# Patient Record
Sex: Female | Born: 1960 | Race: White | Hispanic: No | Smoking: Current every day smoker
Health system: Southern US, Community
[De-identification: ages and names within clinical notes are randomized; demographics above are authoritative.]

## PROBLEM LIST (undated history)

## (undated) DIAGNOSIS — E785 Hyperlipidemia, unspecified: Secondary | ICD-10-CM

## (undated) DIAGNOSIS — G2581 Restless legs syndrome: Secondary | ICD-10-CM

## (undated) HISTORY — PX: SHOULDER ARTHROSCOPY: SHX128

## (undated) HISTORY — PX: TUBAL LIGATION: SHX77

## (undated) HISTORY — PX: BACK SURGERY: SHX140

---

## 1997-10-25 ENCOUNTER — Ambulatory Visit (HOSPITAL_COMMUNITY): Admission: RE | Admit: 1997-10-25 | Discharge: 1997-10-25 | Payer: Self-pay | Admitting: *Deleted

## 1997-11-05 ENCOUNTER — Inpatient Hospital Stay (HOSPITAL_COMMUNITY): Admission: AD | Admit: 1997-11-05 | Discharge: 1997-11-05 | Payer: Self-pay | Admitting: *Deleted

## 1999-01-12 ENCOUNTER — Other Ambulatory Visit: Admission: RE | Admit: 1999-01-12 | Discharge: 1999-01-12 | Payer: Self-pay | Admitting: Family Medicine

## 1999-02-23 ENCOUNTER — Encounter: Payer: Self-pay | Admitting: *Deleted

## 1999-02-23 ENCOUNTER — Ambulatory Visit (HOSPITAL_COMMUNITY): Admission: RE | Admit: 1999-02-23 | Discharge: 1999-02-23 | Payer: Self-pay | Admitting: *Deleted

## 2000-08-18 ENCOUNTER — Ambulatory Visit (HOSPITAL_COMMUNITY): Admission: RE | Admit: 2000-08-18 | Discharge: 2000-08-18 | Payer: Self-pay

## 2004-05-17 ENCOUNTER — Other Ambulatory Visit: Admission: RE | Admit: 2004-05-17 | Discharge: 2004-05-17 | Payer: Self-pay | Admitting: Family Medicine

## 2008-04-13 ENCOUNTER — Emergency Department (HOSPITAL_COMMUNITY): Admission: EM | Admit: 2008-04-13 | Discharge: 2008-04-13 | Payer: Self-pay | Admitting: Emergency Medicine

## 2010-06-27 ENCOUNTER — Encounter: Payer: Self-pay | Admitting: Family Medicine

## 2010-08-03 ENCOUNTER — Other Ambulatory Visit (HOSPITAL_COMMUNITY)
Admission: RE | Admit: 2010-08-03 | Discharge: 2010-08-03 | Disposition: A | Discharge status: Home / Self Care | Payer: Federal, State, Local not specified - PPO | Source: Ambulatory Visit | Attending: Family Medicine | Admitting: Family Medicine

## 2010-08-03 ENCOUNTER — Other Ambulatory Visit: Payer: Self-pay | Admitting: Family Medicine

## 2010-08-03 DIAGNOSIS — Z124 Encounter for screening for malignant neoplasm of cervix: Secondary | ICD-10-CM | POA: Insufficient documentation

## 2010-08-03 DIAGNOSIS — Z1159 Encounter for screening for other viral diseases: Secondary | ICD-10-CM | POA: Insufficient documentation

## 2010-10-19 NOTE — Consult Note (Signed)
NAMESAPHIRE, BARNHART NO.:  [id number]   MEDICAL RECORD NO.:  [id number]          PATIENT TYPE:  EMS   LOCATION:  MAJO                         FACILITY:  [facility]   PHYSICIAN:  Pollyann Savoy, MD  DATE OF BIRTH:  1960/10/03   DATE OF CONSULTATION:  04/13/2008  DATE OF DISCHARGE:                                 CONSULTATION   PRIMARY CARE PHYSICIANS:  Anna Genre. Little, MD and Sigmund Hazel, MD   REFERRING PHYSICIAN:  Pollyann Savoy, MD   REASON FOR CONSULTATION:  Ms. Kalmar is being seen at the request of  Dr. Bernette Mayers for the evaluation of chest pain.   HISTORY OF PRESENT ILLNESS:  Ms. Amyx is a 50 year old female with  prior history of GERD and restless leg syndrome along with 3 prior back  surgeries who quit tobacco in 2004 who over the past few days has had  intensification of chest discomfort.  On Thursday while shopping at a  clothing store, she saw flickering in front of her eyes and then  developed a sudden onset 10/10 intense chest pain which lasted 10-15  minutes around the left side of her breast in axillary area with  associated right arm discomfort.  This occurred while walking around the  store and eventually subsided after stopping and resting.  She states  that she had some mild shortness of breath, diaphoresis surrounding this  incident.  She had another episode yesterday at [facility] in the  hospitality division at around 2:00 p.m. with very strong symptoms.  She  fell like as though she had to sit down to catch her breath and this  chest discomfort lasted approximately 5 minutes.  This morning, she woke  up, feeling some nausea and has been having low-grade mild left-sided  chest discomfort all day long and reports no change with eating.  No  significant shortness of breath or diaphoresis today.  She states that  she is perimenopausal and she has been going through some strange  symptoms recently.  She denies any syncope, bleeding,  fevers on recent  travel, leg pain.  As far as cardiac risk factors ago, she does not have  diabetes, hypertension, and early diabetes or hypertension and she is a  former smoker, unknown lipids at this time although she does not think  she has hyperlipidemia.  Her sister at age 31 year old did die suddenly  of unknown causes after attending a party.  Currently, she is  comfortable in the [facility] without any distress.   PAST MEDICAL HISTORY:  1. Back pain status post 3 prior back surgeries.  2. Restless leg syndrome.  3. GERD - treated over the past few years with omeprazole.   PAST SURGICAL HISTORY:  Back surgery x3.   ALLERGIES:  No known drug allergies.   MEDICATIONS:  1. ReQuip 1 mg once a day.  2. Omeprazole 20 mg once a day.   SOCIAL HISTORY:  She quit smoking in 2004.  After several years, she  drinks no alcohol or very rare wine.  No illicit drug use.  She is  accompanied today by her sister.   FAMILY HISTORY:  Her father was essentially unknown.  Her mother died at  age 58 after stopping eating.  She battled with hypertension.  She had a  sister as above who after a party at age 34 year old went home and died  suddenly.  Unknown cause.   REVIEW OF SYSTEMS:  No bleeding.  No syncope.  No fevers.  She does have  frequent nausea especially in the morning and occasional vomiting which  has been going on for quite some time.  Unless specified above, all  other 12 review of systems negative.   PHYSICAL EXAM:  VITAL SIGNS:  Blood pressure 110/80, heart rate 54,  respirations 16, afebrile, sating 100% on room air.  GENERAL:  Alert and oriented x3 in no acute distress, pleasant, here  with her sister.  EYES:  Well-perfused conjunctivae.  EOMI.  No scleral icterus.  NECK:  Supple.  No JVD.  No carotid bruits.  No thyromegaly appreciated.  No lymphadenopathy.  Full range of motion noted.  CARDIOVASCULAR:  Bradycardic, regular rhythm with no  appreciable  murmurs, rubs, or gallops.  Normal PMI.  LUNGS:  Clear to auscultation bilaterally.  Normal respiratory effort.  No wheezes.  ABDOMEN:  Soft, nontender.  Normoactive bowel sounds.  No bruits  appreciated.  No masses.  EXTREMITIES:  No clubbing, cyanosis, or edema.  Slightly thickened lower  extremities.  Normal distal pulses palpated.  SKIN:  Warm, dry, and intact.  No rashes over chest wall.  NEUROLOGIC:  Nonfocal.  Gait was not assessed.  No tremors note.  PSYCHIATRY:  Normal affect.   DATA:  EKG obtained today in the emergency room shows sinus rhythm with  no other ST changes.  No Q-waves noted.  Normal R-wave progression.  When compared to prior ECG obtained earlier today at [facility], there is no significant change.  Chest x-ray personally reviewed  shows mild hyperinflation of the lungs consistent with mild COPD.  No  acute airspace disease noted.  No cardiomegaly.   LABS:  First set of cardiac biomarkers are all normal.  Chemistry;  unremarkable with sodium 138, potassium 3.7, BUN 13, creatinine 0.66,  glucose 94, white count 6.9, hemoglobin 14.1, hematocrit 43, and  platelets 232.  [facility] records from [facility] reviewed.   ASSESSMENT AND PLAN:  A 50 year old female with a sister who died  suddenly at age 63 from unknown causes, possible sudden cardiac death  with worsening chest pain atypical with prior history of  gastroesophageal reflux disease.  1. Chest pain - atypical chest discomfort.  She did have some mild      tenderness to palpation over the chest wall.  She did not have any      significant upper epigastric or right upper quadrant tenderness.      Possible etiologies do include cardiac, gastroesophageal reflux      disease, discomfort from gallbladder disease.  At this point given      the long duration of her symptoms and negative cardiac biomarkers      as well as reassuring ECG without any acute ST changes, we      discussed  the possibility of obtaining a second set of cardiac      biomarkers here in the emergency room and if this is normal,      discharging home with close followup, stress test in the outpatient      setting.  She is amenable to  this.  I did offer her overnight stay,      however.  She knows to contact me immediately if any of her      symptoms change.  I did ask her to take an aspirin 81 mg once a day      and do also double up on her omeprazole to 20 mg twice a day.      Certainly, these symptoms could be associated with gastroesophageal      reflux disease or gastrointestinal especially given the recurrent      nausea in the a.m. hours.  However, I do feel as though ruling out      a cardiac etiology is very important for her especially in this      perimenopausal and especially with her sister's history of possible      sudden cardiac death.  As of note, her EKG upon close inspection      does not have any evidence of prolonged QT syndrome.  In addition,      she does not have      any classic Brugada criteria.  She denies any palpitations or      syncope.  I will be seeing her in close followup.  She has my card.      After the second set of cardiac biomarkers; if they are normal, I      am fine with her being discharged.      Jake Bathe, MD  Electronically Signed     ______________________________  Pollyann Savoy, MD    MCS/MEDQ  D:  04/13/2008  T:  04/13/2008  Job:  [id number]   cc:   Pollyann Savoy, MD  Anna Genre Little, M.D.  Sigmund Hazel, M.D.

## 2011-03-08 LAB — BASIC METABOLIC PANEL
BUN: 13
CO2: 26
Calcium: 9.4
Chloride: 104
Creatinine, Ser: 0.66
GFR calc Af Amer: >60
GFR calc non Af Amer: >60
Glucose, Bld: 94
Potassium: 3.7
Sodium: 138

## 2011-03-08 LAB — DIFFERENTIAL
Basophils Absolute: 0.1
Basophils Relative: 1
Eosinophils Absolute: 0.2
Eosinophils Relative: 3
Lymphocytes Relative: 36
Lymphs Abs: 2.5
Monocytes Absolute: 0.4
Monocytes Relative: 6
Neutro Abs: 3.7
Neutrophils Relative %: 53

## 2011-03-08 LAB — CBC
HCT: 43.1
Hemoglobin: 14.1
MCHC: 32.7
MCV: 91.2
Platelets: 232
RBC: 4.73
RDW: 12.7
WBC: 6.9

## 2011-03-08 LAB — CK TOTAL AND CKMB (NOT AT ARMC)
CK, MB: 1
Relative Index: INVALID
Relative Index: INVALID
Total CK: 50
Total CK: 60

## 2011-03-08 LAB — TROPONIN I: Troponin I: <0.01

## 2013-02-10 ENCOUNTER — Emergency Department (INDEPENDENT_AMBULATORY_CARE_PROVIDER_SITE_OTHER)
Admission: EM | Admit: 2013-02-10 | Discharge: 2013-02-10 | Disposition: A | Discharge status: Home / Self Care | Payer: Federal, State, Local not specified - PPO | Source: Home / Self Care | Attending: Family Medicine | Admitting: Family Medicine

## 2013-02-10 ENCOUNTER — Emergency Department (INDEPENDENT_AMBULATORY_CARE_PROVIDER_SITE_OTHER): Payer: Federal, State, Local not specified - PPO

## 2013-02-10 DIAGNOSIS — S59909A Unspecified injury of unspecified elbow, initial encounter: Secondary | ICD-10-CM

## 2013-02-10 DIAGNOSIS — S63599A Other specified sprain of unspecified wrist, initial encounter: Secondary | ICD-10-CM

## 2013-02-10 HISTORY — DX: Hyperlipidemia, unspecified: E78.5

## 2013-02-10 MED ORDER — MELOXICAM 15 MG PO TABS: 15.0000 mg | ORAL_TABLET | Freq: Every day | ORAL | Status: DC
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
## 2015-11-27 ENCOUNTER — Ambulatory Visit (HOSPITAL_BASED_OUTPATIENT_CLINIC_OR_DEPARTMENT_OTHER)
Admit: 2015-11-27 | Discharge: 2015-11-27 | Disposition: A | Discharge status: Home / Self Care | Payer: Federal, State, Local not specified - PPO | Attending: Family Medicine | Admitting: Family Medicine

## 2015-11-27 ENCOUNTER — Telehealth: Payer: Self-pay | Admitting: Emergency Medicine

## 2015-11-27 ENCOUNTER — Emergency Department
Admission: EM | Admit: 2015-11-27 | Discharge: 2015-11-27 | Disposition: A | Discharge status: Home / Self Care | Payer: Federal, State, Local not specified - PPO | Source: Home / Self Care

## 2015-11-27 ENCOUNTER — Encounter: Payer: Self-pay | Admitting: *Deleted

## 2015-11-27 DIAGNOSIS — R519 Headache, unspecified: Secondary | ICD-10-CM

## 2015-11-27 DIAGNOSIS — R51 Headache: Secondary | ICD-10-CM | POA: Diagnosis not present

## 2015-11-27 HISTORY — DX: Restless legs syndrome: G25.81

## 2015-11-27 LAB — POCT CBC W AUTO DIFF (K'VILLE URGENT CARE)

## 2015-11-27 NOTE — ED Notes (Addendum)
Pt c/o pain to the left side of her head x 1 months. Today it is much worse, described as burning and sharp. C/o some associated nausea and dizziness. IBF and Excedrin taken without relief. Declined in-house IBF or tylenol

## 2015-11-27 NOTE — ED Provider Notes (Addendum)
CSN: [id number]     Arrival date & time 11/27/15  1353 History   None    Chief Complaint  Patient presents with  . Headache    head pain      HPI Comments: Patient complains of approximately two month history of intermittent daily left side headaches.  During the past two days her headache has been constant, sharp, "pulsing," and throbbing.  She has had difficulty sleeping because of the headache.  No localizing neurologic symptoms.  Patient is a 55 y.o. female presenting with headaches. The history is provided by the patient and the spouse.  Headache Pain location:  L temporal and L parietal Quality:  Sharp and stabbing Radiates to:  Does not radiate Severity currently:  9/10 Severity at highest:  10/10 Onset quality:  Gradual Duration:  8 weeks Timing:  Constant Progression:  Worsening Chronicity:  Chronic Similar to prior headaches: yes   Context: activity and bright light   Relieved by:  Nothing Worsened by:  Nothing Ineffective treatments:  NSAIDs Associated symptoms: no blurred vision, no congestion, no dizziness, no drainage, no ear pain, no eye pain, no facial pain, no fatigue, no fever, no focal weakness, no hearing loss, no loss of balance, no myalgias, no nausea, no near-syncope, no neck pain, no neck stiffness, no numbness, no paresthesias, no photophobia, no sinus pressure, no sore throat, no swollen glands, no tingling and no visual change     Past Medical History  Diagnosis Date  . Hyperlipidemia   . RLS (restless legs syndrome)    Past Surgical History  Procedure Laterality Date  . Back surgery     History reviewed. No pertinent family history. Social History  Substance Use Topics  . Smoking status: Current Every Day Smoker -- 0.50 packs/day  . Smokeless tobacco: None  . Alcohol Use: No   OB History    No data available     Review of Systems  Constitutional: Negative for fever and fatigue.  HENT: Negative for congestion, ear pain, hearing loss,  postnasal drip, sinus pressure and sore throat.        Patient notes mild pain with chewing.  Eyes: Negative for blurred vision, photophobia and pain.  Cardiovascular: Negative for near-syncope.  Gastrointestinal: Negative for nausea.  Musculoskeletal: Negative for myalgias, neck pain and neck stiffness.  Neurological: Positive for headaches. Negative for dizziness, focal weakness, numbness, paresthesias and loss of balance.  All other systems reviewed and are negative.   Allergies  Review of patient's allergies indicates no known allergies.  Home Medications   Prior to Admission medications   Medication Sig Start Date End Date Taking? Authorizing Provider  atorvastatin (LIPITOR) 20 MG tablet Take 20 mg by mouth daily.   Yes Historical Provider, MD  cetirizine (ZYRTEC) 10 MG tablet Take 10 mg by mouth daily.   Yes Historical Provider, MD   Meds Ordered and Administered this Visit  Medications - No data to display  BP 115/80 mmHg  Pulse 78  Temp(Src) 98.4 F (36.9 C) (Oral)  Wt 250 lb (113.399 kg)  SpO2 98% No data found.   Physical Exam  Constitutional: She is oriented to person, place, and time. She appears well-developed and well-nourished. No distress.  HENT:  Head: Normocephalic.    Right Ear: Tympanic membrane, external ear and ear canal normal.  Left Ear: Tympanic membrane, external ear and ear canal normal.  There is diffuse tenderness to palpation over the left temporal and parietal areas as noted on diagram.  There is distinct tenderness to palpation over the left temporal artery.    Eyes: Conjunctivae and EOM are normal. Pupils are equal, round, and reactive to light.  Neck: Neck supple.  Cardiovascular: Normal heart sounds.   Pulmonary/Chest: Breath sounds normal.  Abdominal: There is no tenderness.  Musculoskeletal: She exhibits no edema or tenderness.  Lymphadenopathy:    She has no cervical adenopathy.  Neurological: She is alert and oriented to person,  place, and time. She has normal reflexes. No cranial nerve deficit. Coordination normal.  Skin: Skin is warm and dry. No rash noted.  Nursing note and vitals reviewed.   ED Course  Procedures none    Labs Reviewed  C-REACTIVE PROTEIN - Abnormal; Notable for the following:    CRP 2.0 (*)    All other components within normal limits   Narrative:    Performed at:  [facility]                [address], [address]                [place], [place] [address]  SEDIMENTATION RATE   Narrative:    Performed at:  [facility]                [address], [address]                [place], [place] [address]  POCT CBC W AUTO DIFF ([facility]):  WBC 9.7; LY 33.6; MO 6.5; GR 59.9; Hgb 13.9; Platelets 261     Imaging Review CT Head Wo Contrast (Final result) Result time: 11/27/15 16:45:49   Final result by Rad Results In Interface (11/27/15 16:45:49)   Narrative:   CLINICAL DATA: Left site headache for 2 months, worsening pain and dizziness today  EXAM: CT HEAD WITHOUT CONTRAST  TECHNIQUE: Contiguous axial images were obtained from the base of the skull through the vertex without intravenous contrast.  COMPARISON: None.  FINDINGS: Brain: No intracranial hemorrhage, mass effect or midline shift. No hydrocephalus. No acute cortical infarction. No mass lesion is noted on this unenhanced scan. No intra or extra-axial fluid collection. The gray and white-matter differentiation is preserved.  Vascular: No vascular calcifications are noted.  Skull: Negative for fracture or focal lesion.  Sinuses/Orbits: No acute findings. The mastoid air cells are unremarkable.  Other: None.  IMPRESSION: No acute intracranial abnormality. No hydrocephalus. No definite acute cortical infarction.   Electronically Signed By: Natasha MeadLiviu Pop M.D. On: 11/27/2015 16:45        MDM   1. Left temporal headache    Concern for possible temporal arteritis.  Note  elevated C reactive protein. Recommend follow-up with PCP or neurologist as soon as possible.     Lattie HawStephen A Naveyah Iacovelli, MD 12/02/15 1606  Lattie HawStephen A Galya Dunnigan, MD 12/02/15 [phone]

## 2015-11-28 LAB — SEDIMENTATION RATE: SED RATE: 27 mm/h (ref 0–30)

## 2015-11-28 LAB — C-REACTIVE PROTEIN: CRP: 2 mg/dL — AB (ref <0.60)

## 2015-12-02 ENCOUNTER — Telehealth: Payer: Self-pay | Admitting: Emergency Medicine

## 2015-12-02 NOTE — Discharge Instructions (Signed)

## 2015-12-02 NOTE — ED Notes (Signed)
Dr Cathren HarshBeese would like for you to [facility]: [facility], and see Neurologist for your headaches. Please call if you have any questions. [phone]

## 2016-11-28 ENCOUNTER — Emergency Department
Admission: EM | Admit: 2016-11-28 | Discharge: 2016-11-28 | Disposition: A | Discharge status: Home / Self Care | Payer: Federal, State, Local not specified - PPO | Source: Home / Self Care | Attending: Family Medicine | Admitting: Family Medicine

## 2016-11-28 DIAGNOSIS — T7840XA Allergy, unspecified, initial encounter: Secondary | ICD-10-CM | POA: Diagnosis not present

## 2016-11-28 MED ORDER — CEPHALEXIN 500 MG PO CAPS: 500.0000 mg | ORAL_CAPSULE | Freq: Two times a day (BID) | ORAL | 0 refills | Status: AC

## 2016-11-28 MED ORDER — PREDNISONE 20 MG PO TABS: ORAL_TABLET | ORAL | 0 refills | Status: AC

## 2016-11-28 NOTE — ED Triage Notes (Signed)
Pt was seen for UTI on 6/20.  Placed on antibiotic and developed rash.  Stopped taking antibiotic after 2 doses.  Hives over entire body per patient.

## 2016-11-28 NOTE — ED Provider Notes (Signed)
CSN: [id number]     Arrival date & time 11/28/16  1003 History   First MD Initiated Contact with Patient 11/28/16 1041     Chief Complaint  Patient presents with  . Rash   (Consider location/radiation/quality/duration/timing/severity/associated sxs/prior Treatment) HPI  Jill Mclaughlin is a 56 y.o. female presenting to UC with c/o erythematous itchy rash all over from her face to her toes that waxes and wanes.  Symptoms started about 4 days ago after starting nitrofurantoin on 11/23/16 for a UTI.  She has stopped taking the medication but rash has continued to wax and wane despite taking benadryl.  Denies fever, chills, swelling of mouth or difficulty breathing.  Pt's only known allergy until today is nickel.  Denies other new soaps, lotions or medications.    Past Medical History:  Diagnosis Date  . Hyperlipidemia   . RLS (restless legs syndrome)    Past Surgical History:  Procedure Laterality Date  . BACK SURGERY    . SHOULDER ARTHROSCOPY Bilateral   . TUBAL LIGATION     History reviewed. No pertinent family history. Social History  Substance Use Topics  . Smoking status: Current Every Day Smoker    Packs/day: 0.50  . Smokeless tobacco: Not on file  . Alcohol use No   OB History    No data available     Review of Systems  Constitutional: Negative for chills and fever.  Gastrointestinal: Negative for abdominal pain, nausea and vomiting.  Genitourinary: Positive for dysuria and frequency. Negative for hematuria, pelvic pain and urgency.  Musculoskeletal: Negative for arthralgias and myalgias.  Skin: Positive for rash. Negative for wound.  Neurological: Negative for dizziness and light-headedness.    Allergies  Nitrofurantoin  Home Medications   Prior to Admission medications   Medication Sig Start Date End Date Taking? Authorizing Provider  rOPINIRole (REQUIP) 1 MG tablet Take 1 mg by mouth 3 (three) times daily.   Yes [provider]  simvastatin (ZOCOR)  10 MG tablet Take 10 mg by mouth daily.   Yes [provider]  SUMAtriptan (IMITREX) 50 MG tablet Take 50 mg by mouth every 2 (two) hours as needed for migraine. May repeat in 2 hours if headache persists or recurs.   Yes [provider]  atorvastatin (LIPITOR) 20 MG tablet Take 20 mg by mouth daily.    [provider]  cephALEXin (KEFLEX) 500 MG capsule Take 1 capsule (500 mg total) by mouth 2 (two) times daily. 11/28/16   Lurene Shadow, PA-C  cetirizine (ZYRTEC) 10 MG tablet Take 10 mg by mouth daily.    [provider]  predniSONE (DELTASONE) 20 MG tablet 3 tabs po day one, then 2 po daily x 4 days 11/28/16   Lurene Shadow, PA-C   Meds Ordered and Administered this Visit  Medications - No data to display  BP 118/78 (BP Location: Left Arm)   Pulse 77   Temp 98.3 F (36.8 C) (Oral)   Ht 5\' 7"  (1.702 m)   Wt 255 lb (115.7 kg)   SpO2 97%   BMI 39.94 kg/m  No data found.   Physical Exam  Constitutional: She is oriented to person, place, and time. She appears well-developed and well-nourished. No distress.  HENT:  Head: Normocephalic and atraumatic.  Mouth/Throat: Oropharynx is clear and moist.  Eyes: EOM are normal.  Neck: Normal range of motion.  Cardiovascular: Normal rate and regular rhythm.   Pulmonary/Chest: Effort normal. No stridor. No respiratory distress. She  has no wheezes.  Musculoskeletal: Normal range of motion.  Neurological: She is alert and oriented to person, place, and time.  Skin: Skin is warm and dry. Rash noted. She is not diaphoretic. There is erythema.  Small amount of diffuse erythematous urticaria on face, bilateral arms and lower back.  Psychiatric: She has a normal mood and affect. Her behavior is normal.  Nursing note and vitals reviewed.   Urgent Care Course     Procedures (including critical care time)  Labs Review Labs Reviewed - No data to display  Imaging Review No results found.   MDM   1.  Allergic reaction to drug, initial encounter    Hx and exam c/w allergic reaction most likely due to the nitrofuratoin Will have pt discontinue that antibiotic completely and start on Keflex for UTI as she only had 2 doses of the first medication. Pt declined steroid shot in UC.  Rx: Prednisone and Keflex Home care instructions provided F/u with PCP in 1 week as needed.     Lurene Shadowhelps, Caryssa Elzey O, [place]-C 11/28/16 1205

## 2017-10-25 IMAGING — CT CT HEAD W/O CM
3 series · 15 of 47 positions shown, 18 images · non-contrast
Comparison: None.

CLINICAL DATA: Left site headache for 2 months, worsening pain and
dizziness today

EXAM:
CT HEAD WITHOUT CONTRAST
TECHNIQUE: Contiguous axial images were obtained from the base of the skull
through the vertex without intravenous contrast.

[Series 2: head wo · axial · 0.42mm/px · z∈[-210,-75]mm · 9 of 33 slices shown, 12 images]
[im 3/33  brain]
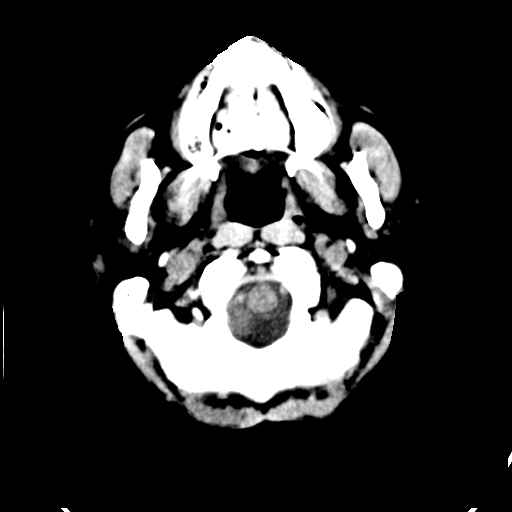
[im 3/33  bone]
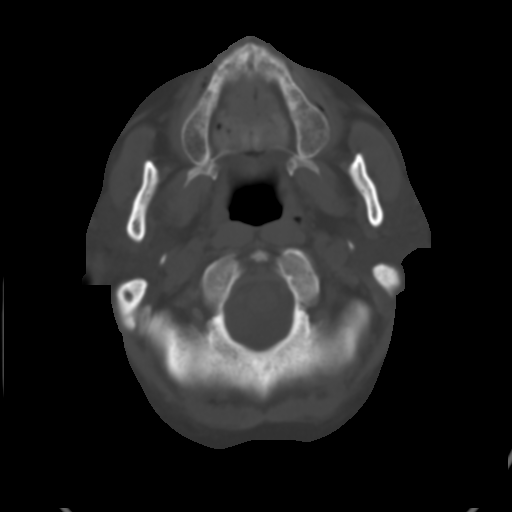
[im 6/33  brain]
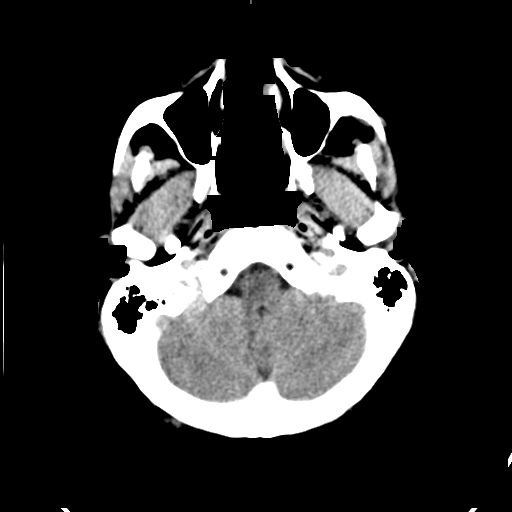
[im 9/33  brain]
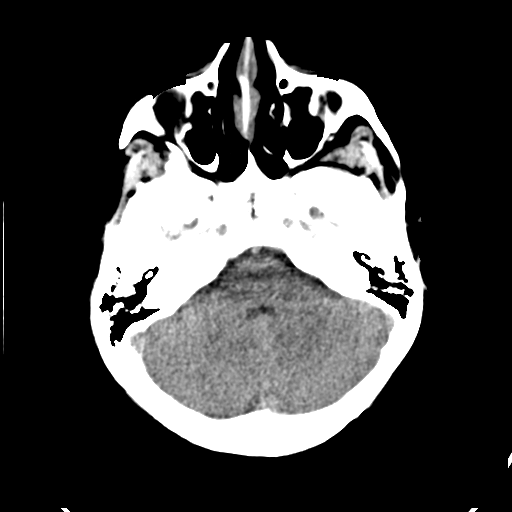
[im 13/33  brain]
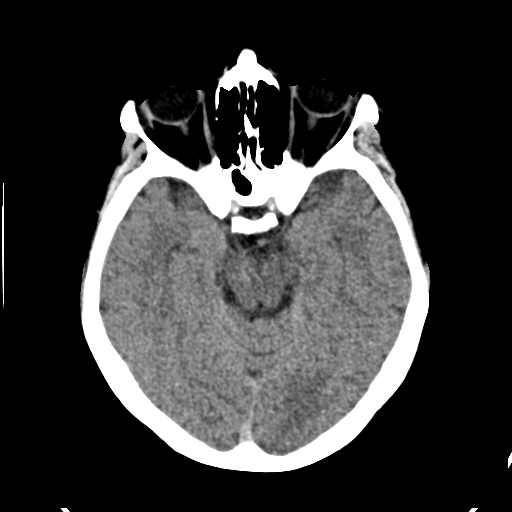
[im 17/33  brain]
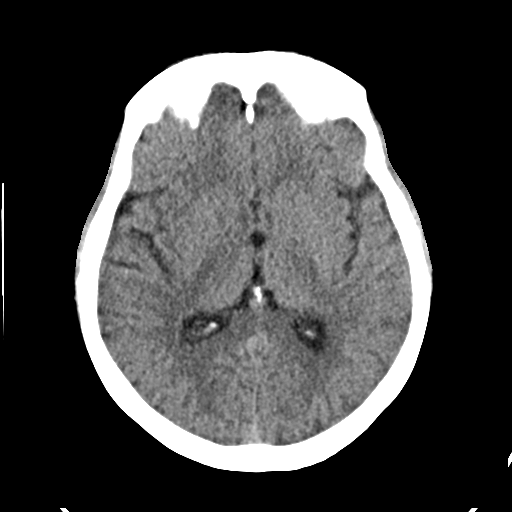
[im 17/33  bone]
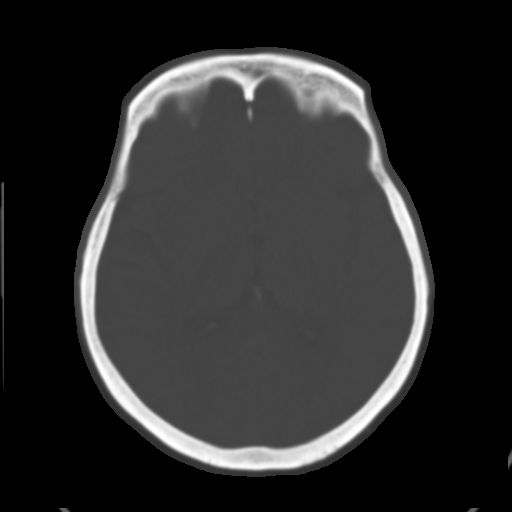
[im 20/33  brain]
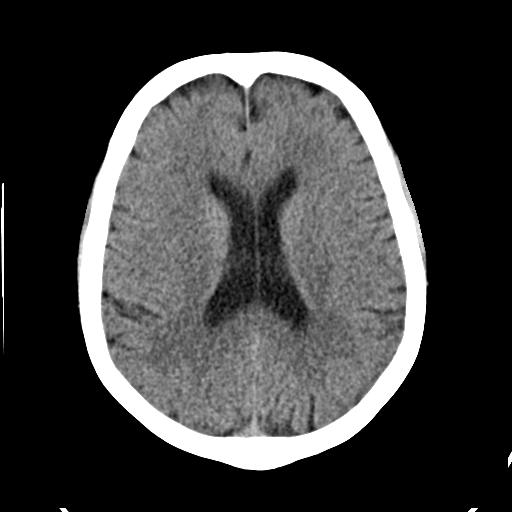
[im 24/33  brain]
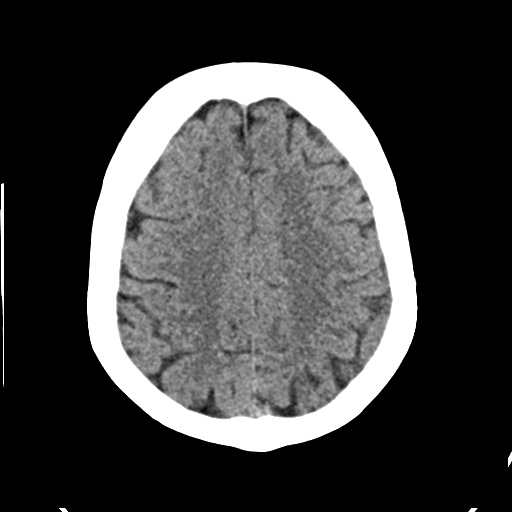
[im 27/33  brain]
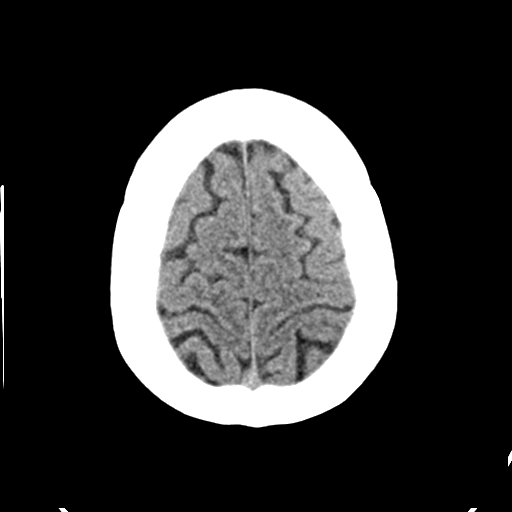
[im 30/33  brain]
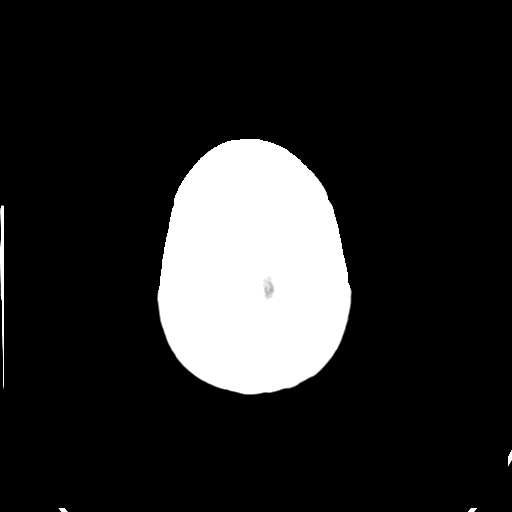
[im 30/33  bone]
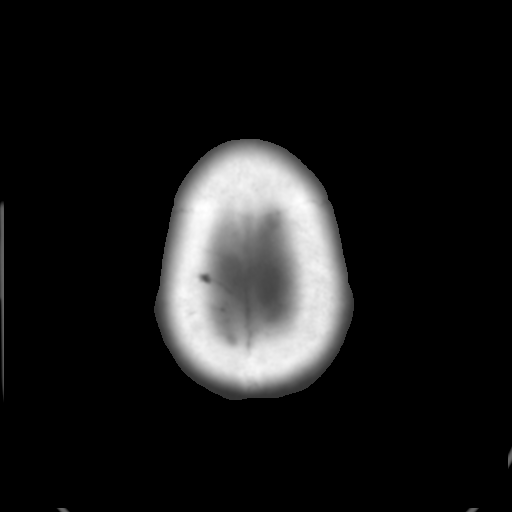

[Series 4: coronal soft · coronal · 0.36mm/px · 3 of 64 slices shown]
[im 22/64  brain]
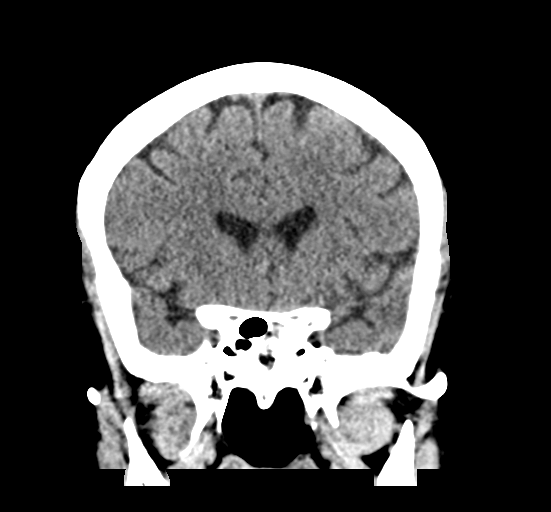
[im 29/64  brain]
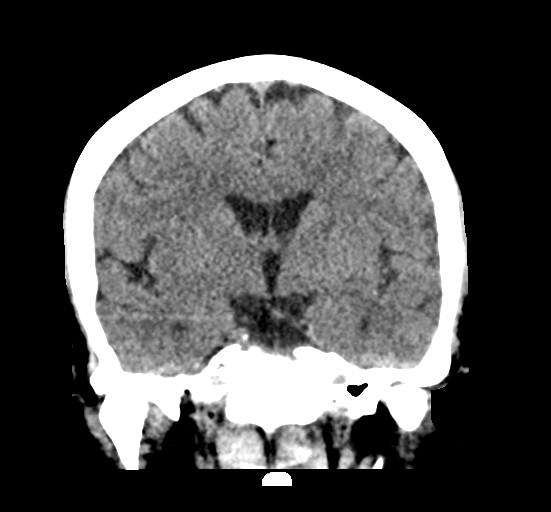
[im 36/64  brain]
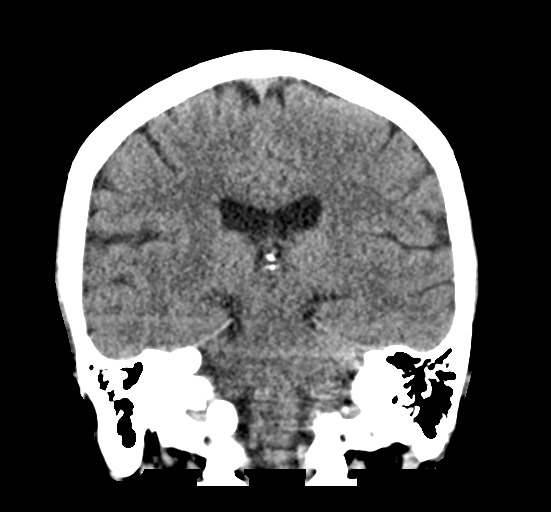

[Series 5: sag soft · sagittal · 0.33mm/px · 3 of 55 slices shown]
[im 19/55  brain]
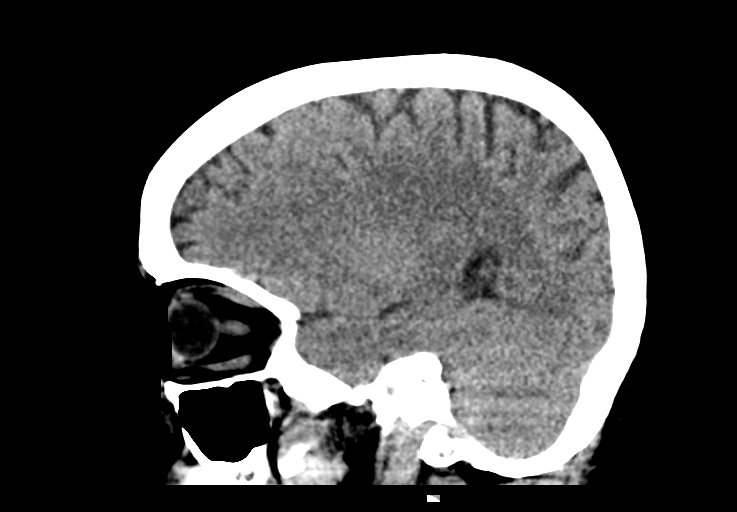
[im 28/55  brain]
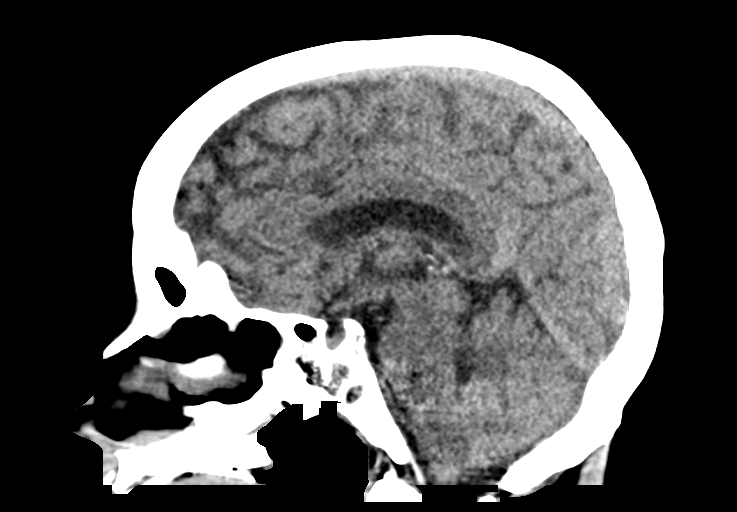
[im 37/55  brain]
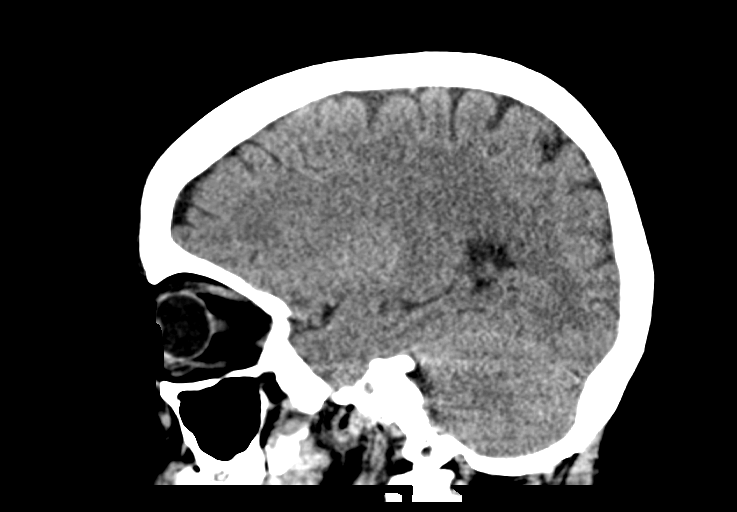

[15 of 47 positions shown; findings below may reference images not displayed]

FINDINGS: Brain: No intracranial hemorrhage, mass effect or midline shift. No
hydrocephalus. No acute cortical infarction. No mass lesion is noted
on this unenhanced scan. No intra or extra-axial fluid collection.
The gray and white-matter differentiation is preserved.

Vascular: No vascular calcifications are noted.

Skull: Negative for fracture or focal lesion.

Sinuses/Orbits: No acute findings. The mastoid air cells are
unremarkable.

Other: None.
IMPRESSION: No acute intracranial abnormality. No hydrocephalus. No definite
acute cortical infarction.

## 2021-05-06 DEATH — deceased
